# Patient Record
Sex: Female | Born: 1985 | Race: White | Hispanic: No | Marital: Single | Smoking: Current every day smoker
Health system: Southern US, Community
[De-identification: ages and names within clinical notes are randomized; demographics above are authoritative.]

## PROBLEM LIST (undated history)

## (undated) DIAGNOSIS — D649 Anemia, unspecified: Secondary | ICD-10-CM

## (undated) DIAGNOSIS — N390 Urinary tract infection, site not specified: Secondary | ICD-10-CM

---
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
## 2006-08-20 IMAGING — US US PELV - US TRANSVAGINAL
1 series · 17 of 25 positions shown · non-contrast
Comparison: none

REASON FOR EXAM: bilateral adnexal pain
COMMENTS:

[Series 1: us pelv - us transvaginal · 17 of 26 slices shown]
[im 1/26]
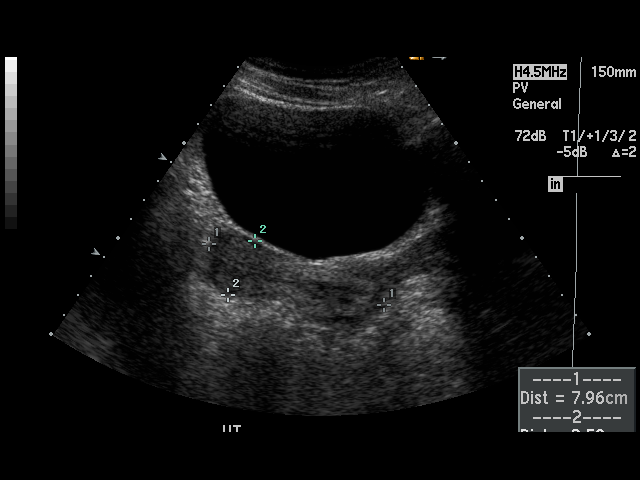
[im 3/26]
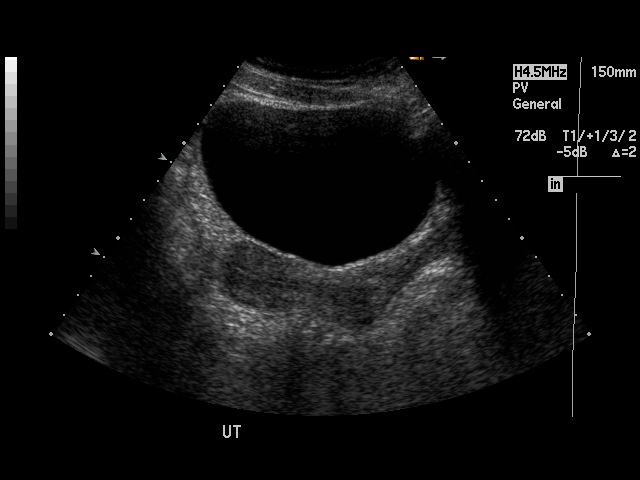
[im 4/26]
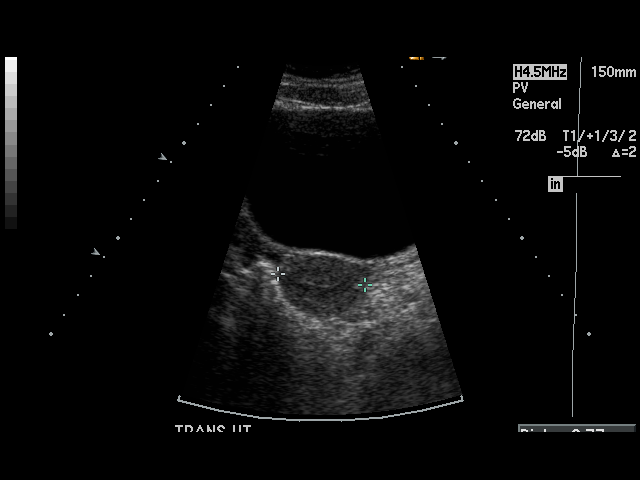
[im 6/26]
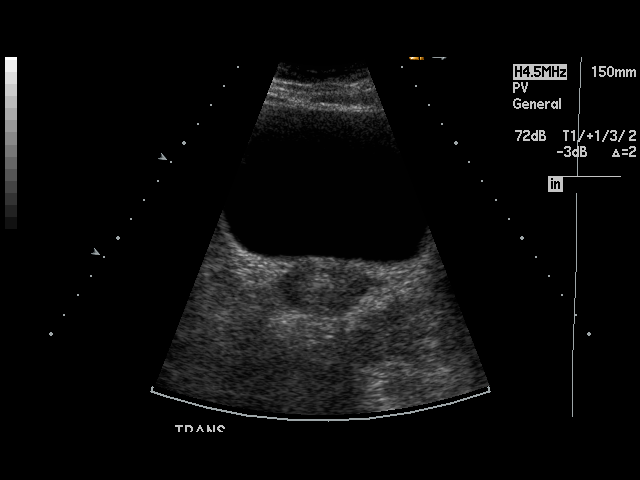
[im 7/26]
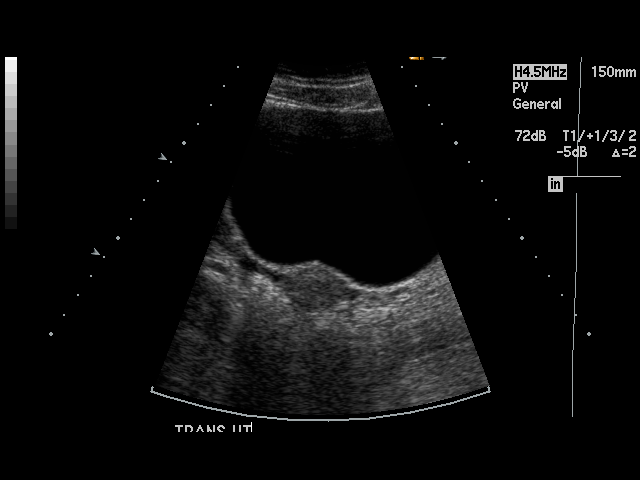
[im 9/26]
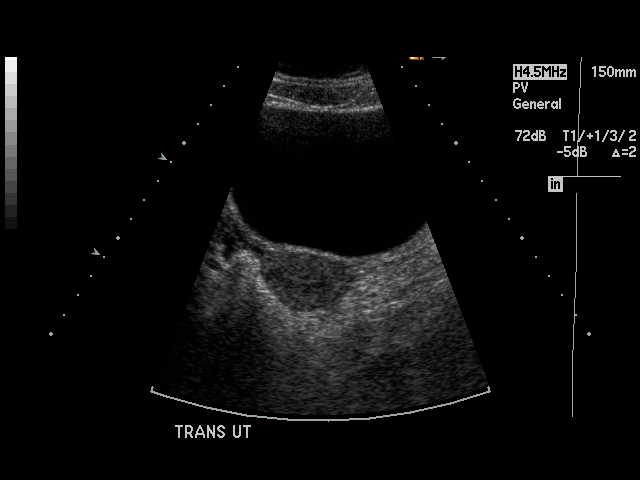
[im 10/26]
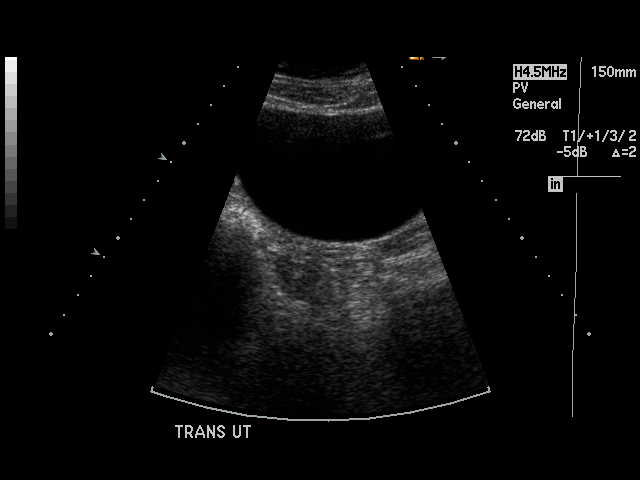
[im 12/26]
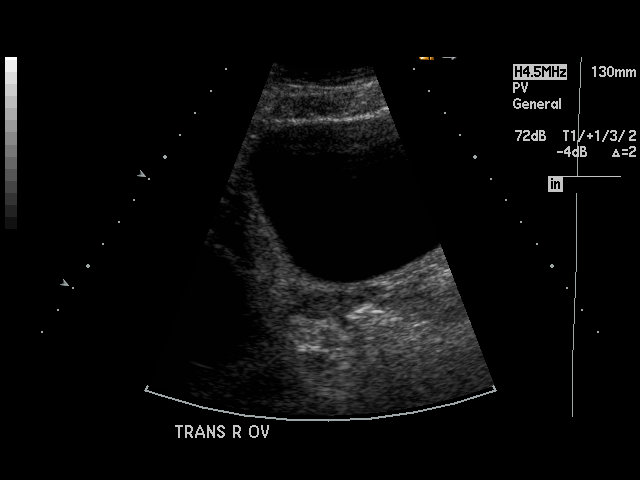
[im 13/26]
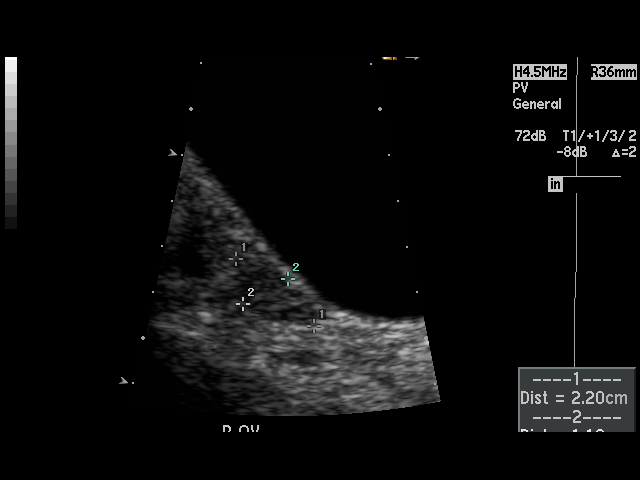
[im 14/26]
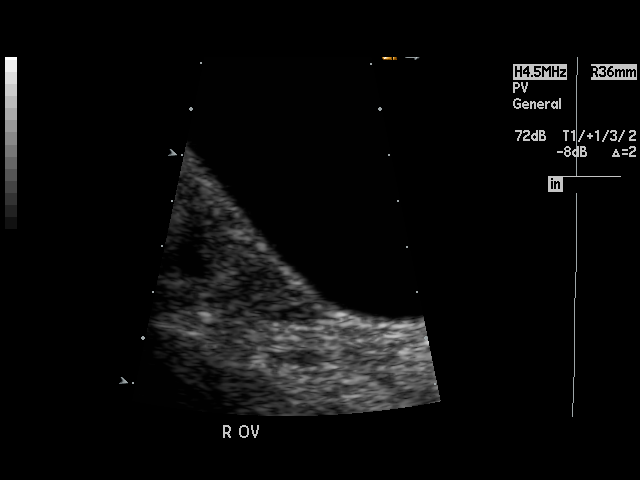
[im 16/26]
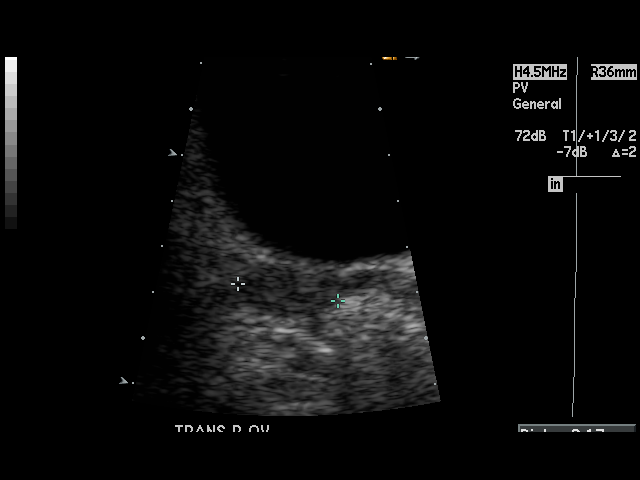
[im 17/26]
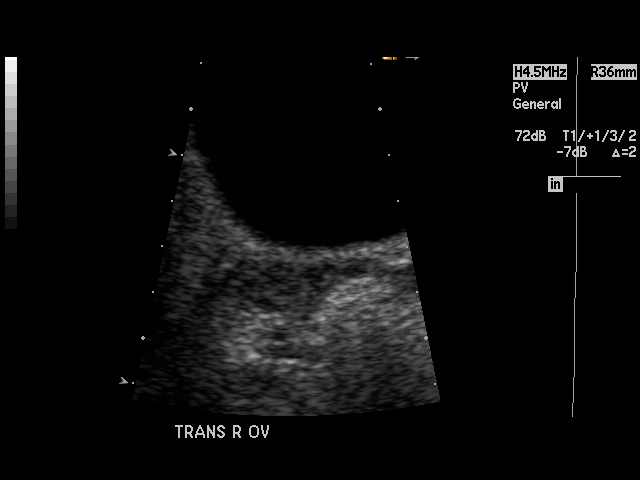
[im 19/26]
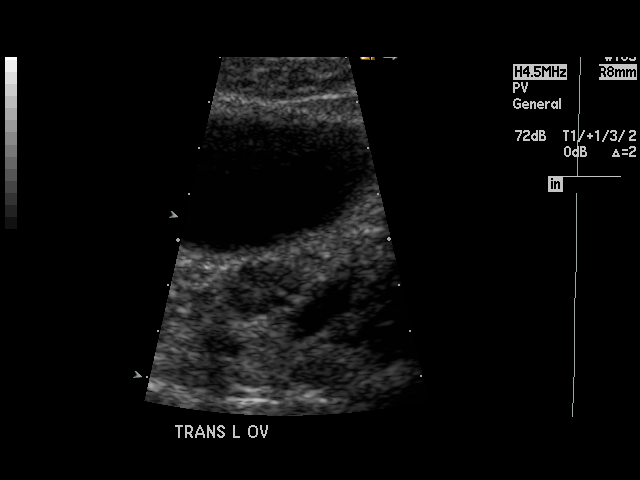
[im 20/26]
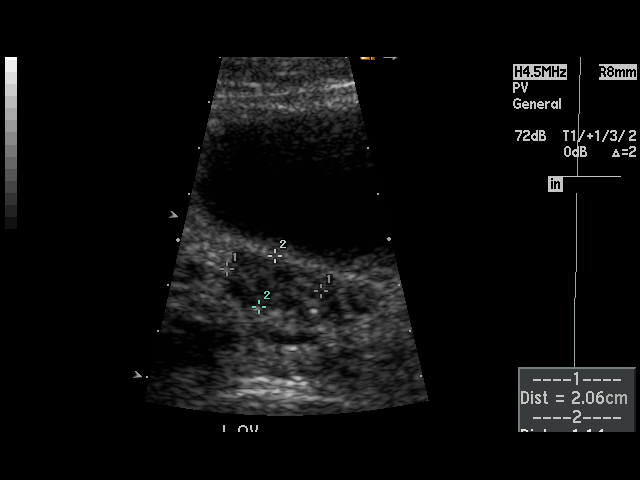
[im 22/26]
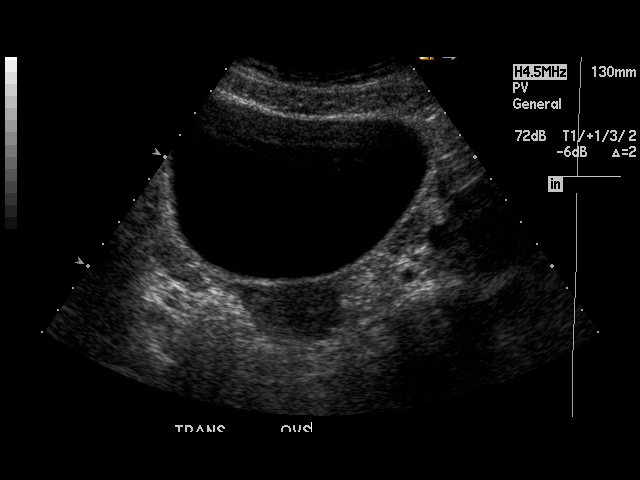
[im 23/26]
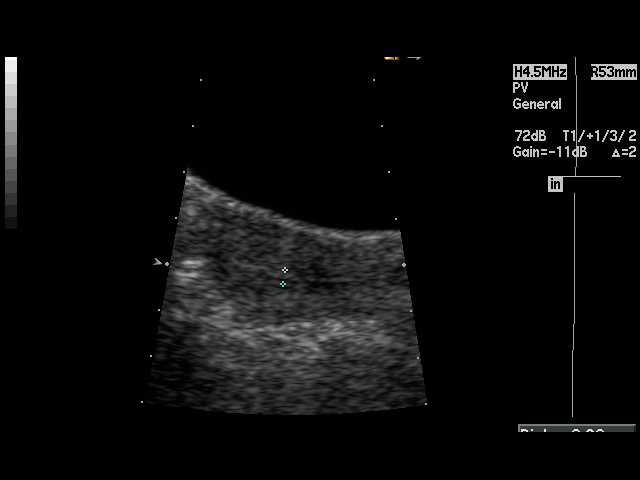
[im 26/26]
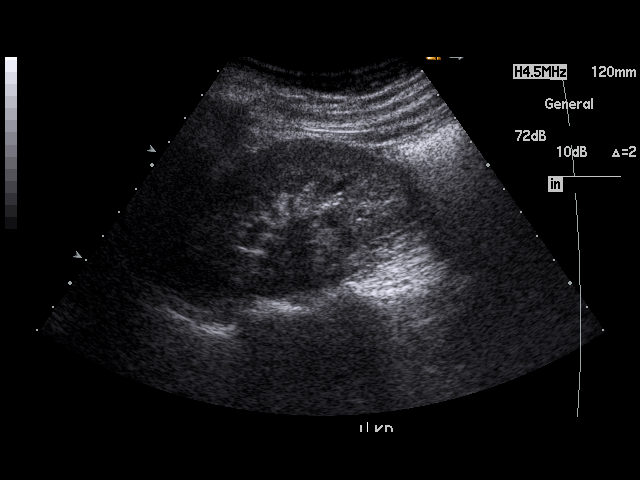

[17 of 25 positions shown; findings below may reference images not displayed]

PROCEDURE:     US  - US PELVIS MASS EXAM  - [DATE]  [DATE] [DATE]  [DATE]

RESULT:        The patient is complaining of adnexal discomfort bilaterally.

The uterus is normal in echotexture and contour measuring approximately 8 x
3.1 x 3.8 cm.  The endometrial stripe measures no more than 1-2 mm in
thickness.  The RIGHT ovary measures 2.2 x 1.1 x 2.2 cm and the LEFT ovary
measures 2.1 x 1.1 x 1.8 cm.  There is no free fluid in the
cul-de-sac.  Survey views of the kidneys are normal.
IMPRESSION: 1.     I do not see evidence of acute abnormality of the uterus.
2.     There are tiny developing follicles within both ovaries but I see no
evidence of ovarian cysts nor other masses within the adnexal regions.  No
free fluid is identified.

## 2008-07-21 ENCOUNTER — Emergency Department: Payer: Self-pay | Admitting: Emergency Medicine

## 2011-07-12 ENCOUNTER — Emergency Department: Payer: Self-pay

## 2011-07-12 LAB — CBC
HCT: 41.9 % (ref 35.0–47.0)
MCHC: 33 g/dL (ref 32.0–36.0)
RBC: 4.98 10*6/uL (ref 3.80–5.20)
RDW: 13.6 % (ref 11.5–14.5)
WBC: 5.8 10*3/uL (ref 3.6–11.0)

## 2011-07-12 LAB — TSH: Thyroid Stimulating Horm: 1.57 u[IU]/mL

## 2011-07-12 LAB — WET PREP, GENITAL

## 2016-07-18 ENCOUNTER — Encounter: Payer: Self-pay | Admitting: *Deleted

## 2016-07-18 ENCOUNTER — Ambulatory Visit
Admission: EM | Admit: 2016-07-18 | Discharge: 2016-07-18 | Disposition: A | Discharge status: Home / Self Care | Payer: BLUE CROSS/BLUE SHIELD | Attending: Family Medicine | Admitting: Family Medicine

## 2016-07-18 DIAGNOSIS — N390 Urinary tract infection, site not specified: Secondary | ICD-10-CM | POA: Diagnosis not present

## 2016-07-18 HISTORY — DX: Anemia, unspecified: D64.9

## 2016-07-18 HISTORY — DX: Urinary tract infection, site not specified: N39.0

## 2016-07-18 LAB — URINALYSIS, COMPLETE (UACMP) WITH MICROSCOPIC
Bilirubin Urine: NEGATIVE
Glucose, UA: NEGATIVE mg/dL
Ketones, ur: NEGATIVE mg/dL
Leukocytes, UA: NEGATIVE
Nitrite: NEGATIVE
Protein, ur: NEGATIVE mg/dL
RBC / HPF: NONE SEEN RBC/hpf (ref 0–5)
Specific Gravity, Urine: 1.01 (ref 1.005–1.030)
pH: 6.5 (ref 5.0–8.0)

## 2016-07-18 MED ORDER — PHENAZOPYRIDINE HCL 200 MG PO TABS: 200.0000 mg | ORAL_TABLET | Freq: Three times a day (TID) | ORAL | 0 refills | Status: AC | PRN

## 2016-07-18 MED ORDER — CEPHALEXIN 500 MG PO CAPS: 500.0000 mg | ORAL_CAPSULE | Freq: Two times a day (BID) | ORAL | 0 refills | Status: AC

## 2016-07-18 NOTE — ED Triage Notes (Signed)
Patient started having pelvic pain 2 weeks ago. Patient reports that the pelvic pain usually a sign of a UTI. Patient has a chronic history of UTI.

## 2016-07-18 NOTE — ED Provider Notes (Addendum)
[facility]    CSN: [id number] Arrival date & time: 07/18/16  1302     History   Chief Complaint Chief Complaint  Patient presents with  . Pelvic Pain    HPI Kara Khan is a 31 y.o. female.   Patient is here because of abdominal pain. She states that when she is trained as a UTI. She started hurting about 3 days ago. Yesterday she states she stayed at home during the day and drank time of water. That seemed to have helped. When asked whether she's had a smelly urine she states that she sat lock [employer] place so she could smell anything. She has had discharge for the past but found out that she washes her underwear real concerned at the extremity type of older exposure and wears concern or vaginal area. She has a hygienic practices she follows. She has had a history of UTIs and she and mother reminds me that since her neurologist that she was a little baby to be evaluated. Has some concerns about whether she may need to see urologist now we'll start suggest a since she does not have a PCP at this time that in the very near future when she sees a GYN she talks her GYN about taking a urology referral. She has history of anemia UTI. Mother has had interstitial cystitis His had her bladder removed. She does smoke anymore she stop smoking. No pertinent surgical history. Both mother and father have diabetes. She is allergic to Entex.   The history is provided by the patient. No language interpreter was used.  Pelvic Pain  This is a new problem. The current episode started more than 2 days ago. The problem occurs constantly. The problem has been gradually worsening. Associated symptoms include abdominal pain. Pertinent negatives include no chest pain, no headaches and no shortness of breath. The symptoms are aggravated by bending. Nothing relieves the symptoms. She has tried nothing for the symptoms. The treatment provided mild relief.    Past Medical History:    Diagnosis Date  . Anemia   . UTI (urinary tract infection)     There are no active problems to display for this patient.   History reviewed. No pertinent surgical history.  OB History    No data available       Home Medications    Prior to Admission medications   Medication Sig Start Date End Date Taking? Authorizing Provider  cephALEXin (KEFLEX) 500 MG capsule Take 1 capsule (500 mg total) by mouth 2 (two) times daily. 07/18/16   Hassan Rowan, MD  phenazopyridine (PYRIDIUM) 200 MG tablet Take 1 tablet (200 mg total) by mouth 3 (three) times daily as needed for pain. 07/18/16   Hassan Rowan, MD    Family History Family History  Problem Relation Age of Onset  . Diabetes Mother   . Interstitial cystitis Mother   . Diabetes Father     Social History Social History  Substance Use Topics  . Smoking status: Current Every Day Smoker    Packs/day: 0.50    Types: Cigarettes  . Smokeless tobacco: Never Used  . Alcohol use Yes     Allergies   Entex lq [phenylephrine-guaifenesin]   Review of Systems Review of Systems  Respiratory: Negative for shortness of breath.   Cardiovascular: Negative for chest pain.  Gastrointestinal: Positive for abdominal pain.  Genitourinary: Positive for pelvic pain.  Neurological: Negative for headaches.  All other systems reviewed and are negative.  Physical Exam Triage Vital Signs ED Triage Vitals  Enc Vitals Group     BP 07/18/16 1357 119/77     Pulse Rate 07/18/16 1357 77     Resp 07/18/16 1357 16     Temp 07/18/16 1357 98.2 F (36.8 C)     Temp Source 07/18/16 1357 Oral     SpO2 07/18/16 1357 100 %     Weight 07/18/16 1358 115 lb (52.2 kg)     Height 07/18/16 1358 5\' 2"  (1.575 m)     Head Circumference --      Peak Flow --      Pain Score 07/18/16 1359 3     Pain Loc --      Pain Edu? --      Excl. in GC? --    No data found.   Updated Vital Signs BP 119/77 (BP Location: Left Arm)   Pulse 77   Temp 98.2 F  (36.8 C) (Oral)   Resp 16   Ht 5\' 2"  (1.575 m)   Wt 115 lb (52.2 kg)   LMP 07/04/2016   SpO2 100%   BMI 21.03 kg/m   Visual Acuity Right Eye Distance:   Left Eye Distance:   Bilateral Distance:    Right Eye Near:   Left Eye Near:    Bilateral Near:     Physical Exam  Constitutional: She is oriented to person, place, and time. She appears well-developed and well-nourished. No distress.  HENT:  Head: Normocephalic and atraumatic.  Right Ear: External ear normal.  Left Ear: External ear normal.  Mouth/Throat: Oropharynx is clear and moist.  Eyes: Pupils are equal, round, and reactive to light.  Neck: Normal range of motion. Neck supple.  Abdominal: She exhibits distension.  Musculoskeletal: Normal range of motion. She exhibits no edema or deformity.  Neurological: She is alert and oriented to person, place, and time.  Skin: Skin is warm and dry. She is not diaphoretic.  Psychiatric: She has a normal mood and affect.  Vitals reviewed.    UC Treatments / Results  Labs (all labs ordered are listed, but only abnormal results are displayed) Labs Reviewed  URINALYSIS, COMPLETE (UACMP) WITH MICROSCOPIC - Abnormal; Notable for the following:       Result Value   Color, Urine STRAW (*)    Hgb urine dipstick TRACE (*)    Squamous Epithelial / LPF 0-5 (*)    Bacteria, UA RARE (*)    All other components within normal limits  URINE CULTURE    EKG  EKG Interpretation None       Radiology No results found.  Procedures Procedures (including critical care time)  Medications Ordered in UC Medications - No data to display  Results for orders placed or performed during the hospital encounter of 07/18/16  Urinalysis, Complete w Microscopic  Result Value Ref Range   Color, Urine STRAW (A) YELLOW   APPearance CLEAR CLEAR   Specific Gravity, Urine 1.010 1.005 - 1.030   pH 6.5 5.0 - 8.0   Glucose, UA NEGATIVE NEGATIVE mg/dL   Hgb urine dipstick TRACE (A) NEGATIVE    Bilirubin Urine NEGATIVE NEGATIVE   Ketones, ur NEGATIVE NEGATIVE mg/dL   Protein, ur NEGATIVE NEGATIVE mg/dL   Nitrite NEGATIVE NEGATIVE   Leukocytes, UA NEGATIVE NEGATIVE   Squamous Epithelial / LPF 0-5 (A) NONE SEEN   WBC, UA 0-5 0 - 5 WBC/hpf   RBC / HPF NONE SEEN 0 - 5 RBC/hpf   Bacteria,  UA RARE (A) NONE SEEN    Initial Impression / Assessment and Plan / UC Course  I have reviewed the triage vital signs and the nursing notes.  Pertinent labs & imaging results that were available during my care of the patient were reviewed by me and considered in my medical decision making (see chart for details). We'll treat for UTI with Keflex 500 mg twice a day and Pyridium 3 times a day for 5 days recommend follow-up with urologist through her GYN or which his PCP. Offered her work note for tomorrow. Urine culture was also ordered.   Final Clinical Impressions(s) / UC Diagnoses   Final diagnoses:  Lower urinary tract infectious disease    New Prescriptions New Prescriptions   CEPHALEXIN (KEFLEX) 500 MG CAPSULE    Take 1 capsule (500 mg total) by mouth 2 (two) times daily.   PHENAZOPYRIDINE (PYRIDIUM) 200 MG TABLET    Take 1 tablet (200 mg total) by mouth 3 (three) times daily as needed for pain.    Note: This dictation was prepared with Dragon dictation along with smaller phrase technology. Any transcriptional errors that result from this process are unintentional.   Hassan RowanWade, Field Staniszewski, MD 07/18/16 1605    Hassan RowanWade, Chaddrick Brue, MD 07/18/16 1606

## 2016-07-20 LAB — URINE CULTURE: Special Requests: NORMAL
